# Patient Record
Sex: Male | Born: 1995
Health system: Southern US, Community
[De-identification: ages and names within clinical notes are randomized; demographics above are authoritative.]

## PROBLEM LIST (undated history)

## (undated) DIAGNOSIS — E639 Nutritional deficiency, unspecified: Secondary | ICD-10-CM

## (undated) DIAGNOSIS — Z720 Tobacco use: Secondary | ICD-10-CM

## (undated) DIAGNOSIS — E669 Obesity, unspecified: Principal | ICD-10-CM

## (undated) HISTORY — PX: TONSILLECTOMY: SUR1361

## (undated) HISTORY — DX: Tobacco use: Z72.0

## (undated) HISTORY — PX: EAR TUBE REMOVAL: SHX1486

## (undated) HISTORY — DX: Nutritional deficiency, unspecified: E63.9

## (undated) HISTORY — DX: Obesity, unspecified: E66.9

---
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
## 2016-06-28 ENCOUNTER — Encounter: Payer: Self-pay | Admitting: Family Medicine

## 2016-06-28 ENCOUNTER — Ambulatory Visit (INDEPENDENT_AMBULATORY_CARE_PROVIDER_SITE_OTHER): Payer: PRIVATE HEALTH INSURANCE | Admitting: Family Medicine

## 2016-06-28 VITALS — BP 110/69 | HR 55 | Ht 70.0 in | Wt 242.1 lb

## 2016-06-28 DIAGNOSIS — Z720 Tobacco use: Secondary | ICD-10-CM | POA: Diagnosis not present

## 2016-06-28 DIAGNOSIS — E669 Obesity, unspecified: Secondary | ICD-10-CM | POA: Insufficient documentation

## 2016-06-28 DIAGNOSIS — E639 Nutritional deficiency, unspecified: Secondary | ICD-10-CM | POA: Diagnosis not present

## 2016-06-28 HISTORY — DX: Obesity, unspecified: E66.9

## 2016-06-28 HISTORY — DX: Tobacco use: Z72.0

## 2016-06-28 HISTORY — DX: Nutritional deficiency, unspecified: E63.9

## 2016-06-28 NOTE — Assessment & Plan Note (Signed)
Patient admittedly does not eat the best diet and we will discuss this further in the future. I recommend he looked up online about Mediterranean diets.

## 2016-06-28 NOTE — Patient Instructions (Addendum)
Discussed with patient weight loss and how in the future if he wants to we can discuss pharmacological treatment of that.  Discussed with patient needs yearly screening for STDs. Even though he is very low risk.  Normal blood pressure 119/79 or less.  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  120-150oz/water per day.            Why follow it? Research shows. . Those who follow the Mediterranean diet have a reduced risk of heart disease  . The diet is associated with a reduced incidence of Parkinson's and Alzheimer's diseases . People following the diet may have longer life expectancies and lower rates of chronic diseases  . The Dietary Guidelines for Americans recommends the Mediterranean diet as an eating plan to promote health and prevent disease  What Is the Mediterranean Diet?  . Healthy eating plan based on typical foods and recipes of Mediterranean-style cooking . The diet is primarily a plant based diet; these foods should make up a majority of meals   Starches - Plant based foods should make up a majority of meals - They are an important sources of vitamins, minerals, energy, antioxidants, and fiber - Choose whole grains, foods high in fiber and minimally processed items  - Typical grain sources include wheat, oats, barley, corn, brown rice, bulgar, farro, millet, polenta, couscous  - Various types of beans include chickpeas, lentils, fava beans, black beans, white beans   Fruits  Veggies - Large quantities of antioxidant rich fruits & veggies; 6 or more servings  - Vegetables can be eaten raw or lightly drizzled with oil and cooked  - Vegetables common to the traditional Mediterranean Diet include: artichokes, arugula, beets, broccoli, brussel sprouts, cabbage, carrots, celery, collard greens, cucumbers, eggplant, kale, leeks, lemons, lettuce, mushrooms, okra, onions, peas, peppers, potatoes, pumpkin, radishes,  rutabaga, shallots, spinach, sweet potatoes, turnips, zucchini - Fruits common to the Mediterranean Diet include: apples, apricots, avocados, cherries, clementines, dates, figs, grapefruits, grapes, melons, nectarines, oranges, peaches, pears, pomegranates, strawberries, tangerines  Fats - Replace butter and margarine with healthy oils, such as olive oil, canola oil, and tahini  - Limit nuts to no more than a handful a day  - Nuts include walnuts, almonds, pecans, pistachios, pine nuts  - Limit or avoid candied, honey roasted or heavily salted nuts - Olives are central to the [employer] - can be eaten whole or used in a variety of dishes   Meats Protein - Limiting red meat: no more than a few times a month - When eating red meat: choose lean cuts and keep the portion to the size of deck of cards - Eggs: approx. 0 to 4 times a week  - Fish and lean poultry: at least 2 a week  - Healthy protein sources include, chicken, [place], lean beef, lamb - Increase intake of seafood such as tuna, salmon, trout, mackerel, shrimp, scallops - Avoid or limit high fat processed meats such as sausage and bacon  Dairy - Include moderate amounts of low fat dairy products  - Focus on healthy dairy such as fat free yogurt, skim milk, low or reduced fat cheese - Limit dairy products higher in fat such as whole or 2% milk, cheese, ice cream  Alcohol - Moderate amounts of red wine is ok  - No more than 5 oz daily for women (all ages) and men older than age 87  -  No more than 10 oz of wine daily for men younger than 68  Other - Limit sweets and other desserts  - Use herbs and spices instead of salt to flavor foods  - Herbs and spices common to the traditional Mediterranean Diet include: basil, bay leaves, chives, cloves, cumin, fennel, garlic, lavender, marjoram, mint, oregano, parsley, pepper, rosemary, sage, savory, sumac, tarragon, thyme   It's not just a diet, it's a lifestyle:  . The Mediterranean diet  includes lifestyle factors typical of those in the region  . Foods, drinks and meals are best eaten with others and savored . Daily physical activity is important for overall good health . This could be strenuous exercise like running and aerobics . This could also be more leisurely activities such as walking, housework, yard-work, or taking the stairs . Moderation is the key; a balanced and healthy diet accommodates most foods and drinks . Consider portion sizes and frequency of consumption of certain foods   Meal Ideas & Options:  . Breakfast:  o Whole wheat toast or whole wheat English muffins with peanut butter & hard boiled egg o Steel cut oats topped with apples & cinnamon and skim milk  o Fresh fruit: banana, strawberries, melon, berries, peaches  o Smoothies: strawberries, bananas, greek yogurt, peanut butter o Low fat greek yogurt with blueberries and granola  o Egg white omelet with spinach and mushrooms o Breakfast couscous: whole wheat couscous, apricots, skim milk, cranberries  . Sandwiches:  o Hummus and grilled vegetables (peppers, zucchini, squash) on whole wheat bread   o Grilled chicken on whole wheat pita with lettuce, tomatoes, cucumbers or tzatziki  o Tuna salad on whole wheat bread: tuna salad made with greek yogurt, olives, red peppers, capers, green onions o Garlic rosemary lamb pita: lamb sauted with garlic, rosemary, salt & pepper; add lettuce, cucumber, greek yogurt to pita - flavor with lemon juice and black pepper  . Seafood:  o Mediterranean grilled salmon, seasoned with garlic, basil, parsley, lemon juice and black pepper o Shrimp, lemon, and spinach whole-grain pasta salad made with low fat greek yogurt  o Seared scallops with lemon orzo  o Seared tuna steaks seasoned salt, pepper, coriander topped with tomato mixture of olives, tomatoes, olive oil, minced garlic, parsley, green onions and cappers  . Meats:  o Herbed greek chicken salad with kalamata  olives, cucumber, feta  o Red bell peppers stuffed with spinach, bulgur, lean ground beef (or lentils) & topped with feta   o Kebabs: skewers of chicken, tomatoes, onions, zucchini, squash  o [place] burgers: made with red onions, mint, dill, lemon juice, feta cheese topped with roasted red peppers . Vegetarian o Cucumber salad: cucumbers, artichoke hearts, celery, red onion, feta cheese, tossed in olive oil & lemon juice  o Hummus and whole grain pita points with a greek salad (lettuce, tomato, feta, olives, cucumbers, red onion) o Lentil soup with celery, carrots made with vegetable broth, garlic, salt and pepper  o Tabouli salad: parsley, bulgur, mint, scallions, cucumbers, tomato, radishes, lemon juice, olive oil, salt and pepper.        Exercising to Lose Weight Exercising can help you to lose weight. In order to lose weight through exercise, you need to do vigorous-intensity exercise. You can tell that you are exercising with vigorous intensity if you are breathing very hard and fast and cannot hold a conversation while exercising. Moderate-intensity exercise helps to maintain your current weight. You can tell that you are exercising at a  moderate level if you have a higher heart rate and faster breathing, but you are still able to hold a conversation. HOW OFTEN SHOULD I EXERCISE? Choose an activity that you enjoy and set realistic goals. Your health care provider can help you to make an activity plan that works for you. Exercise regularly as directed by your health care provider. This may include:  Doing resistance training twice each week, such as:  Push-ups.  Sit-ups.  Lifting weights.  Using resistance bands.  Doing a given intensity of exercise for a given amount of time. Choose from these options:  150 minutes of moderate-intensity exercise every week.  75 minutes of vigorous-intensity exercise every week.  A mix of moderate-intensity and vigorous-intensity exercise every  week. Children, pregnant women, people who are out of shape, people who are overweight, and older adults may need to consult a health care provider for individual recommendations. If you have any sort of medical condition, be sure to consult your health care provider before starting a new exercise program. WHAT ARE SOME ACTIVITIES THAT CAN HELP ME TO LOSE WEIGHT?   Walking at a rate of at least 4.5 miles an hour.  Jogging or running at a rate of 5 miles per hour.  Biking at a rate of at least 10 miles per hour.  Lap swimming.  Roller-skating or in-line skating.  Cross-country skiing.  Vigorous competitive sports, such as football, basketball, and soccer.  Jumping rope.  Aerobic dancing. HOW CAN I BE MORE ACTIVE IN MY DAY-TO-DAY ACTIVITIES?  Use the stairs instead of the elevator.  Take a walk during your lunch break.  If you drive, park your car farther away from work or school.  If you take public transportation, get off one stop early and walk the rest of the way.  Make all of your phone calls while standing up and walking around.  Get up, stretch, and walk around every 30 minutes throughout the day. WHAT GUIDELINES SHOULD I FOLLOW WHILE EXERCISING?  Do not exercise so much that you hurt yourself, feel dizzy, or get very short of breath.  Consult your health care provider prior to starting a new exercise program.  Wear comfortable clothes and shoes with good support.  Drink plenty of water while you exercise to prevent dehydration or heat stroke. Body water is lost during exercise and must be replaced.  Work out until you breathe faster and your heart beats faster.   This information is not intended to replace advice given to you by your health care provider. Make sure you discuss any questions you have with your health care provider.   Document Released: 01/13/2011 Document Revised: 01/01/2015 Document Reviewed: 05/14/2014 Elsevier Interactive Patient Education  [employer].

## 2016-06-28 NOTE — Assessment & Plan Note (Signed)
Recommended patient go see a dentist at least twice yearly. Recommend abstinence. Patient will consider it.

## 2016-06-28 NOTE — Progress Notes (Signed)
Marjory Sneddon, D.O. Primary care at [place]:    Chief Complaint  Patient presents with  . Establish Care   New pt, here to establish care.   HPI: Richard Petersen is a pleasant 20 y.o. male who presents to [facility] today To become established. Patient does not recall the last time he was at a primary care physician's office.  No complaints today.  Here because his mother told him he should come.  No formal exercise. 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  Maybe drinks "a couple of beers a couple days of the week."  Is going to the dentist at least yearly.     Past Medical History  Diagnosis Date  . Obese 06/28/2016  . Chews tobacco 06/28/2016  . Poor diet 06/28/2016      Past Surgical History  Procedure Laterality Date  . Tonsillectomy    . Ear tube removal        Family History  Problem Relation Age of Onset  . Hypertension Mother   . Hypertension Father       History  Drug Use No  ,    History  Alcohol Use No  ,    History  Smoking status  . Never Smoker   Smokeless tobacco  . Current User  . Types: Chew  ,     History  Sexual Activity  . Sexual Activity: Not Currently      Patient's Medications   No medications on file     Amoxicillin No outpatient encounter prescriptions on file as of 06/28/2016.   No facility-administered encounter medications on file as of 06/28/2016.      There are no preventive care reminders to display for this patient.    There is no immunization history on file for this patient.   <no information>   Fall Risk  06/28/2016  Falls in the past year? No     Depression screen PHQ 2/9 06/28/2016  Decreased Interest 0  Down, Depressed, Hopeless 0  PHQ - 2 Score 0      Review of Systems:   ( Completed via Adult Medical History Intake form  today ) General:   Denies fever, chills, appetite changes, unexplained weight loss.  Optho/Auditory:   Denies visual changes, blurred vision/LOV, ringing in ears/ diff hearing Respiratory:   Denies SOB, DOE, cough, wheezing.  Cardiovascular:   Denies chest pain, palpitations, new onset peripheral edema  Gastrointestinal:   Denies nausea, vomiting, diarrhea.  Genitourinary:    Denies dysuria, increased frequency, flank pain.  Endocrine:     Denies hot or cold intolerance, polyuria, polydipsia. Musculoskeletal:  Denies unexplained myalgias, joint swelling, arthralgias, gait problems.  Skin:  Denies rash, suspicious lesions or new/ changes in moles Neurological:    Denies dizziness, syncope, unexplained weakness, lightheadedness, numbness  Psychiatric/Behavioral:   Denies mood changes, suicidal or homicidal ideations, hallucinations    Objective:   Blood pressure 110/69, pulse 55, height 5\' 10"  (1.778 m), weight 242 lb 1.6 oz (109.816 kg). Body mass index is 34.74 kg/(m^2).  General: Well Developed, well nourished, and in no acute distress.  Neuro: Alert and oriented x3, extra-ocular muscles intact, sensation grossly intact.  HEENT: Normocephalic, atraumatic, pupils equal round reactive to light, neck supple Skin: no gross suspicious lesions or rashes  Cardiac: Regular rate and rhythm, no murmurs rubs or gallops.  Respiratory: Essentially clear to auscultation bilaterally. Not using accessory muscles, speaking in full sentences.  Abdominal: Soft, not grossly distended Musculoskeletal: Ambulates w/o diff, FROM * 4 ext.  Vasc: less 2 sec cap RF, warm and pink  Psych:  No HI/SI, judgement and insight good.    Impression and Recommendations:    The patient was counselled, risk factors were discussed, anticipatory guidance given.  Chews tobacco Recommended patient go see a dentist at least twice yearly. Recommend abstinence. Patient will consider it.  Obese Counseling done regarding  the connection between being overweight and various disease states of hyperlipidemia, Hypertension, prediabetes, arthritis etc.  Recommend the patient consider losing weight and I recommend he make a follow-up office visit with me in the future to discuss various treatment options and make a plan together.  Patient is fasting today so we will obtain blood work today.  Poor diet Patient admittedly does not eat the best diet and we will discuss this further in the future. I recommend he looked up online about Mediterranean diets.   Please see AVS handed out to patient at the end of our visit for further patient instructions/ counseling done pertaining to today's office visit.  Gross side effects, risk and benefits, and alternatives of medications discussed with patient.  Patient is aware that all medications have potential side effects and we are unable to predict every sideeffect or drug-drug interaction that may occur.  Expresses verbal understanding and consents to current therapy plan and treatment regiment.  Note: This document was prepared using Dragon voice recognition software and may include unintentional dictation errors.

## 2016-06-28 NOTE — Assessment & Plan Note (Signed)
Counseling done regarding the connection between being overweight and various disease states of hyperlipidemia, Hypertension, prediabetes, arthritis etc.  Recommend the patient consider losing weight and I recommend he make a follow-up office visit with me in the future to discuss various treatment options and make a plan together.  Patient is fasting today so we will obtain blood work today.

## 2016-06-29 LAB — CBC
HCT: 46 % (ref 38.5–50.0)
Hemoglobin: 15.5 g/dL (ref 13.2–17.1)
MCH: 29.5 pg (ref 27.0–33.0)
MCHC: 33.7 g/dL (ref 32.0–36.0)
MCV: 87.5 fL (ref 80.0–100.0)
MPV: 9.3 fL (ref 7.5–12.5)
PLATELETS: 223 10*3/uL (ref 140–400)
RBC: 5.26 MIL/uL (ref 4.20–5.80)
RDW: 13.9 % (ref 11.0–15.0)
WBC: 5.3 10*3/uL (ref 3.8–10.8)

## 2016-06-29 LAB — COMPREHENSIVE METABOLIC PANEL
ALT: 21 U/L (ref 9–46)
AST: 15 U/L (ref 10–40)
Albumin: 5 g/dL (ref 3.6–5.1)
Alkaline Phosphatase: 71 U/L (ref 40–115)
BUN: 15 mg/dL (ref 7–25)
CALCIUM: 9.6 mg/dL (ref 8.6–10.3)
CO2: 26 mmol/L (ref 20–31)
Chloride: 101 mmol/L (ref 98–110)
Creat: 0.87 mg/dL (ref 0.60–1.35)
GLUCOSE: 85 mg/dL (ref 65–99)
POTASSIUM: 4.2 mmol/L (ref 3.5–5.3)
SODIUM: 140 mmol/L (ref 135–146)
Total Bilirubin: 0.9 mg/dL (ref 0.2–1.2)
Total Protein: 7.2 g/dL (ref 6.1–8.1)

## 2016-06-29 LAB — LIPID PANEL
CHOL/HDL RATIO: 2.3 ratio (ref <=5.0)
CHOLESTEROL: 117 mg/dL — AB (ref 125–170)
HDL: 51 mg/dL (ref >=40)
LDL CALC: 57 mg/dL (ref <110)
TRIGLYCERIDES: 46 mg/dL (ref <150)
VLDL: 9 mg/dL (ref <30)

## 2016-06-29 LAB — HEMOGLOBIN A1C
Hgb A1c MFr Bld: 4.7 % (ref <5.7)
Mean Plasma Glucose: 88 mg/dL

## 2016-06-29 LAB — TSH: TSH: 1.74 mIU/L (ref 0.40–4.50)

## 2016-09-08 ENCOUNTER — Ambulatory Visit: Payer: PRIVATE HEALTH INSURANCE

## 2017-03-14 ENCOUNTER — Encounter: Payer: Self-pay | Admitting: Adult Health

## 2017-03-14 ENCOUNTER — Ambulatory Visit (INDEPENDENT_AMBULATORY_CARE_PROVIDER_SITE_OTHER): Payer: PRIVATE HEALTH INSURANCE | Admitting: Adult Health

## 2017-03-14 VITALS — BP 130/67 | HR 68 | Ht 70.25 in | Wt 246.0 lb

## 2017-03-14 DIAGNOSIS — K921 Melena: Secondary | ICD-10-CM

## 2017-03-14 NOTE — Patient Instructions (Signed)
Lower Gastrointestinal Bleeding °Lower gastrointestinal (GI) bleeding is the result of bleeding from the colon, rectum, or anal area. The colon is the last part of the digestive tract, where stool, also called feces, is formed. If you have lower GI bleeding, you may see blood in or on your stool. It may be bright red. °Lower GI bleeding often stops without treatment. Continued or heavy bleeding needs emergency treatment at the hospital. °What are the causes? °Lower GI bleeding may be caused by: °· A condition that causes pouches to form in the colon over time (diverticulosis). °· Swelling and irritation (inflammation) in areas with diverticulosis (diverticulitis). °· Inflammation of the colon (inflammatory bowel disease). °· Swollen veins in the rectum (hemorrhoids). °· Painful tears in the anus (anal fissures), often caused by passing hard stools. °· Cancer of the colon or rectum. °· Noncancerous growths (polyps) of the colon or rectum. °· A bleeding disorder that impairs the formation of blood clots and causes easy bleeding (coagulopathy). °· An abnormal weakening of a blood vessel where an artery and a vein come together (arteriovenous malformation). °What increases the risk? °You are more likely to develop this condition if: °· You are older than 21 years of age. °· You take aspirin or NSAIDs on a regular basis. °· You take anticoagulant or antiplatelet drugs. °· You have a history of high-dose X-ray treatment (radiation therapy) of the colon. °· You recently had a colon polyp removed. °What are the signs or symptoms? °Symptoms of this condition include: °· Bright red blood or blood clots coming from your rectum. °· Bloody stools. °· Black or maroon-colored stools. °· Pain or cramping in the abdomen. °· Weakness or dizziness. °· Racing heartbeat. °How is this diagnosed? °This condition may be diagnosed based on: °· Your symptoms and medical history. °· A physical exam. During the exam, your health care provider  will check for signs of blood loss, such as low blood pressure and a rapid pulse. °· Tests, such as: °¨ Flexible sigmoidoscopy. In this procedure, a flexible tube with a camera on the end is used to examine your anus and the first part of your colon to look for the source of bleeding. °¨ Colonoscopy. This is similar to a flexible sigmoidoscopy, but the camera can extend all the way to the uppermost part of your colon. °¨ Blood tests to measure your red blood cell count and to check for coagulopathy. °¨ An imaging study of your colon to look for a bleeding site. In some cases, you may have X-rays taken after a dye or radioactive substance is injected into your bloodstream (angiogram). °How is this treated? °Treatment for this condition depends on the cause of the bleeding. Heavy or persistent bleeding is treated at the hospital. Treatment may include: °· Getting fluids through an IV tube inserted into one of your veins. °· Getting blood through an IV tube (blood transfusion). °· Stopping bleeding through high-heat coagulation, injections of certain medicines, or applying surgical clips. This can all be done during a colonoscopy. °· Having a procedure that involves first doing an angiogram and then blocking blood flow to the bleeding site (embolization). °· Stopping some of your regular medicines for a certain amount of time. °· Having surgery to remove part of the colon. This may be needed if bleeding is severe and does not respond to other treatment. °Follow these instructions at home: °· Take over-the-counter and prescription medicines only as told by your health care provider. You may need to avoid   aspirin, NSAIDs, or other medicines that increase bleeding.  Eat foods that are high in fiber. This will help keep your stools soft. These foods include whole grains, legumes, fruits, and vegetables. Eating 1-3 prunes each day works well for many people.  Drink enough fluid to keep your urine clear or pale  yellow.  Keep all follow-up visits as told by your health care provider. This is important. Contact a health care provider if:  Your symptoms do not improve. Get help right away if:  Your bleeding increases.  You feel light-headed or you faint.  You feel weak.  You have severe cramps in your back or abdomen.  You pass large blood clots in your stool.  Your symptoms get worse. This information is not intended to replace advice given to you by your health care provider. Make sure you discuss any questions you have with your health care provider. Document Released: 04/27/2016 Document Revised: 05/18/2016 Document Reviewed: 04/27/2016 Elsevier Interactive Patient Education  2017 [employer].  Will call when lab results are available. Continue healthy eating and regular exercise. Avoid alcohol and tobacco. Please call clinic with any questions/concerns.

## 2017-03-14 NOTE — Progress Notes (Signed)
Subjective:    Patient ID: Richard Petersen, male    DOB: Apr 24, 1996, 21 y.o.   MRN: [id number]  HPI:  Mr. Dehart presents for blood in stool that began approx. 2 months ago. Reports blood is bright red and he notices both on toilet paper and mixed in with stool.  He reports when visiting the beach several weeks ago "the toilet bowl was filled with red blood", this only occurred once.  He denies N/V or blood in urine.  He estimates blood in stool 1-2 times a week and he reports mild/gnawing LLQ pain prior to BM with blood that will resolve after BM.  He denies this ever occurring before. He denies recent travel outside the [place] or change in eating habits prior to onset of sx's.  He denies first degree family with colon ca, however father and sister both have hemorrhoids.  He denies fever/night sweats/malaise/change in appetite/unexplained weight loss. His normal evacuation patten-BM every 2 days, denies straining or diarrhea. About a week ago he started a low CHO diet and increased regular exercise. He denies smoking or excessive ETOH use, however does use chewing tobacco. Mother at [facility] during encounter. Of note:  It was very difficult to obtain detailed history, speint > 30 mins during H&P information session.  Patient Care Team    Relationship Specialty Notifications Start End  Mellody Dance, DO PCP - General Family Medicine  06/28/16     Patient Active Problem List   Diagnosis Date Noted  . Blood in stool 03/14/2017  . Obese 06/28/2016  . Chews tobacco 06/28/2016  . Poor diet 06/28/2016     Past Medical History:  Diagnosis Date  . Chews tobacco 06/28/2016  . Obese 06/28/2016  . Poor diet 06/28/2016     Past Surgical History:  Procedure Laterality Date  . EAR TUBE REMOVAL    . TONSILLECTOMY       Family History  Problem Relation Age of Onset  . Hypertension Mother   . Hypertension Father      History  Drug Use No     History  Alcohol Use No     History  Smoking  Status  . Never Smoker  Smokeless Tobacco  . Current User  . Types: Chew     No outpatient encounter prescriptions on file as of 03/14/2017.   No facility-administered encounter medications on file as of 03/14/2017.     Allergies: Amoxicillin  There is no height or weight on file to calculate BMI.  There were no vitals taken for this visit.   Review of Systems  Constitutional: Negative for activity change, appetite change, chills, diaphoresis, fatigue, fever and unexpected weight change.  Eyes: Negative for visual disturbance.  Respiratory: Negative for cough, chest tightness, shortness of breath, wheezing and stridor.   Cardiovascular: Negative for chest pain and leg swelling.  Gastrointestinal: Positive for abdominal pain and blood in stool. Negative for abdominal distention, anal bleeding, constipation, diarrhea, nausea, rectal pain and vomiting.  Endocrine: Negative for cold intolerance, heat intolerance, polydipsia, polyphagia and polyuria.  Genitourinary: Negative for difficulty urinating and hematuria.  Skin: Negative for color change, pallor, rash and wound.  Allergic/Immunologic: Negative for immunocompromised state.  Neurological: Negative for dizziness and headaches.  Hematological: Does not bruise/bleed easily.       Objective:   Physical Exam  Constitutional: He is oriented to person, place, and time. He appears well-developed and well-nourished. No distress.  HENT:  Head: Normocephalic and atraumatic.  Cardiovascular: Normal rate,  regular rhythm, normal heart sounds and intact distal pulses.   No murmur heard. Pulmonary/Chest: Effort normal and breath sounds normal. No respiratory distress. He has no wheezes. He has no rales. He exhibits no tenderness.  Abdominal: Soft. Bowel sounds are normal. He exhibits no distension and no mass. There is no hepatosplenomegaly. There is no tenderness. There is no rigidity, no rebound, no guarding, no CVA tenderness, no  tenderness at McBurney's point and negative Murphy's sign.  Refused Guaiac testing twice. Did consent to lab draw for CBC.  Neurological: He is alert and oriented to person, place, and time.  Skin: Skin is warm and dry. No rash noted. He is not diaphoretic. No erythema. No pallor.  Psychiatric: He has a normal mood and affect. His behavior is normal. Judgment and thought content normal.  Nursing note and vitals reviewed.         Assessment & Plan:   1. Blood in stool     Blood in stool Refused Guaiac testing twice during encounter. Consented to CBC lab draw-will call when results are available. GI referral placed. Discussed at length the IMPORTANCE of going to GI. He communicated understanding/agreement. Mother at [facility].    FOLLOW-UP:  Return if symptoms worsen or fail to improve.

## 2017-03-14 NOTE — Assessment & Plan Note (Signed)
Refused Guaiac testing twice during encounter. Consented to CBC lab draw-will call when results are available. GI referral placed. Discussed at length the IMPORTANCE of going to GI. He communicated understanding/agreement. Mother at [facility].

## 2017-03-15 LAB — CBC WITH DIFFERENTIAL/PLATELET
BASOS: 0 %
Basophils Absolute: 0 10*3/uL (ref 0.0–0.2)
EOS (ABSOLUTE): 0.2 10*3/uL (ref 0.0–0.4)
Eos: 3 %
HEMATOCRIT: 42.7 % (ref 37.5–51.0)
HEMOGLOBIN: 15.2 g/dL (ref 13.0–17.7)
IMMATURE GRANS (ABS): 0 10*3/uL (ref 0.0–0.1)
Immature Granulocytes: 0 %
LYMPHS ABS: 2.2 10*3/uL (ref 0.7–3.1)
LYMPHS: 27 %
MCH: 29.6 pg (ref 26.6–33.0)
MCHC: 35.6 g/dL (ref 31.5–35.7)
MCV: 83 fL (ref 79–97)
MONOCYTES: 14 %
Monocytes Absolute: 1.1 10*3/uL — ABNORMAL HIGH (ref 0.1–0.9)
NEUTROS ABS: 4.4 10*3/uL (ref 1.4–7.0)
Neutrophils: 56 %
Platelets: 240 10*3/uL (ref 150–379)
RBC: 5.13 x10E6/uL (ref 4.14–5.80)
RDW: 13 % (ref 12.3–15.4)
WBC: 7.9 10*3/uL (ref 3.4–10.8)

## 2017-03-16 ENCOUNTER — Encounter: Payer: Self-pay | Admitting: Gastroenterology

## 2017-04-23 ENCOUNTER — Telehealth: Payer: Self-pay | Admitting: Gastroenterology

## 2017-04-23 ENCOUNTER — Ambulatory Visit: Payer: PRIVATE HEALTH INSURANCE | Admitting: Gastroenterology

## 2018-08-22 ENCOUNTER — Encounter: Payer: Self-pay | Admitting: Internal Medicine

## 2018-10-23 ENCOUNTER — Encounter: Payer: Self-pay | Admitting: *Deleted

## 2018-10-23 ENCOUNTER — Ambulatory Visit (INDEPENDENT_AMBULATORY_CARE_PROVIDER_SITE_OTHER): Payer: 59 | Admitting: Internal Medicine

## 2018-10-23 VITALS — BP 112/78 | HR 66 | Ht 72.0 in | Wt 227.0 lb

## 2018-10-23 DIAGNOSIS — K625 Hemorrhage of anus and rectum: Secondary | ICD-10-CM

## 2018-10-23 MED ORDER — SUPREP BOWEL PREP KIT 17.5-3.13-1.6 GM/177ML PO SOLN: 1.0000 | ORAL | 0 refills | Status: DC

## 2018-10-23 NOTE — Patient Instructions (Addendum)
You have been scheduled for a colonoscopy. Please follow written instructions given to you at your visit today.  Please pick up your prep supplies at the pharmacy within the next 1-3 days. If you use inhalers (even only as needed), please bring them with you on the day of your procedure. Your physician has requested that you go to www.startemmi.com and enter the access code given to you at your visit today. This web site gives a general overview about your procedure. However, you should still follow specific instructions given to you by our office regarding your preparation for the procedure.  If you are age 4 or older, your body mass index should be between 23-30. Your Body mass index is 30.79 kg/m. If this is out of the aforementioned range listed, please consider follow up with your Primary Care Provider.  If you are age 22 or younger, your body mass index should be between 19-25. Your Body mass index is 30.79 kg/m. If this is out of the aformentioned range listed, please consider follow up with your Primary Care Provider.

## 2018-10-23 NOTE — Progress Notes (Signed)
Patient ID: Richard Petersen, male   DOB: 1996/12/20, 22 y.o.   MRN: [id number] HPI: Richard Petersen is a 22 year old male with little past medical history who is seen in consultation at the request of Dr. Raliegh Scarlet to evaluate rectal bleeding.  He is here today with his mother.  He reports that he has had intermittent rectal bleeding over the last 18 months but perhaps as far back as 3 years.  He reports this is infrequent and painless.  He notices it when he looks in the toilet or with wiping.  He reports at times the blood has been rather high volume and filled the toilet with red blood.  He reports this can happen on one isolated day and then not again or it can happen 2 to 3 days in a row.  He denies a change in bowel habit and reports his stools vary from loose to normal to hard at times.  He will have a bowel movement every day to every 2 to 3 days.  He rarely goes more than once per day.  He is not having abdominal pain, rectal pain, tenesmus.  For him he feels that his bowel habits have not changed.  He does not see narrow or thin stool.  He denies melena.  No upper GI complaints such as heartburn, dysphagia or odynophagia.  No hepatobiliary complaint.  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  Only prior surgery was tonsillectomy   Past Medical History:  Diagnosis Date  . Chews tobacco 06/28/2016  . Obese 06/28/2016  . Poor diet 06/28/2016    Past Surgical History:  Procedure Laterality Date  . EAR TUBE REMOVAL    . TONSILLECTOMY      No outpatient medications prior to visit.   No facility-administered medications prior to visit.     Allergies  Allergen Reactions  . Amoxicillin Other (See Comments)    agitation    Family History  Problem Relation Age of Onset  . Hypertension Mother   . Hypertension Father     Social History   Tobacco Use  . Smoking status: Never Smoker  . Smokeless tobacco: Current User    Types: Chew  Substance Use Topics  .  Alcohol use: No  . Drug use: No    ROS: As per history of present illness, otherwise negative  There were no vitals taken for this visit. Constitutional: Well-developed and well-nourished. No distress. HEENT: Normocephalic and atraumatic.  Conjunctivae are normal.  No scleral icterus. Neck: Neck supple. Trachea midline. Cardiovascular: Normal rate, regular rhythm and intact distal pulses. No M/R/G Pulmonary/chest: Effort normal and breath sounds normal. No wheezing, rales or rhonchi. Abdominal: Soft, nontender, nondistended. Bowel sounds active throughout. There are no masses palpable. No hepatosplenomegaly. Extremities: no clubbing, cyanosis, or edema Neurological: Alert and oriented to person place and time. Skin: Skin is warm and dry.  Psychiatric: Normal mood and affect. Behavior is normal.  RELEVANT LABS AND IMAGING: CBC    Component Value Date/Time   WBC 7.9 03/14/2017 1606   WBC 5.3 06/28/2016 0933   RBC 5.13 03/14/2017 1606   RBC 5.26 06/28/2016 0933   HGB 15.2 03/14/2017 1606   HCT 42.7 03/14/2017 1606   PLT 240 03/14/2017 1606   MCV 83 03/14/2017 1606   MCH 29.6 03/14/2017 1606   MCH 29.5 06/28/2016 0933   MCHC 35.6 03/14/2017 1606   MCHC 33.7 06/28/2016 0933   RDW 13.0 03/14/2017 1606   LYMPHSABS  2.2 03/14/2017 1606   MONOABS 0.9 02/23/2009 1200   EOSABS 0.2 03/14/2017 1606   BASOSABS 0.0 03/14/2017 1606    CMP     Component Value Date/Time   NA 140 06/28/2016 0933   K 4.2 06/28/2016 0933   CL 101 06/28/2016 0933   CO2 26 06/28/2016 0933   GLUCOSE 85 06/28/2016 0933   BUN 15 06/28/2016 0933   CREATININE 0.87 06/28/2016 0933   CALCIUM 9.6 06/28/2016 0933   PROT 7.2 06/28/2016 0933   ALBUMIN 5.0 06/28/2016 0933   AST 15 06/28/2016 0933   ALT 21 06/28/2016 0933   ALKPHOS 71 06/28/2016 0933   BILITOT 0.9 06/28/2016 0933    ASSESSMENT/PLAN: 22 year old male with little past medical history who presents to evaluate rectal bleeding.   1.  Rectal  bleeding --22 year old with painless rectal bleeding.  Symptoms are not that for fissure.  Most likely internal hemorrhoid but given intermittent and ongoing nature over time I recommend that we pursue colonoscopy for definitive diagnosis and to exclude other pathology such as proctitis, or much less likely distal colonic polyp.  We discussed the nature of the procedure including the risk, benefits and alternatives and he is agreeable and wishes to proceed.   OV:[id number], Neoma Laming, Do [address] [place], [place] [address]

## 2018-11-01 ENCOUNTER — Encounter: Payer: Self-pay | Admitting: Internal Medicine

## 2018-11-01 ENCOUNTER — Ambulatory Visit (AMBULATORY_SURGERY_CENTER): Payer: 59 | Admitting: Internal Medicine

## 2018-11-01 VITALS — BP 101/45 | HR 68 | Temp 99.1°F | Resp 13 | Ht 72.0 in | Wt 227.0 lb

## 2018-11-01 DIAGNOSIS — K621 Rectal polyp: Secondary | ICD-10-CM | POA: Diagnosis not present

## 2018-11-01 DIAGNOSIS — K625 Hemorrhage of anus and rectum: Secondary | ICD-10-CM | POA: Diagnosis not present

## 2018-11-01 DIAGNOSIS — K6289 Other specified diseases of anus and rectum: Secondary | ICD-10-CM

## 2018-11-01 DIAGNOSIS — D123 Benign neoplasm of transverse colon: Secondary | ICD-10-CM

## 2018-11-01 DIAGNOSIS — K635 Polyp of colon: Secondary | ICD-10-CM | POA: Diagnosis not present

## 2018-11-01 MED ORDER — SODIUM CHLORIDE 0.9 % IV SOLN: 500.0000 mL | Freq: Once | INTRAVENOUS | Status: DC

## 2018-11-01 NOTE — Progress Notes (Signed)
Pt's states no medical or surgical changes since previsit or office visit. 

## 2018-11-01 NOTE — Progress Notes (Signed)
PT taken to PACU. Monitors in place. VSS. Report given to RN. 

## 2018-11-01 NOTE — Op Note (Signed)
[place] Patient Name: Richard Petersen Procedure Date: 11/01/2018 7:59 AM MRN: [id number] Endoscopist: Jerene Bears , MD Age: 22 Referring MD:  Date of Birth: 1996/10/01 Gender: Male Account #: [id number] Procedure:                Colonoscopy Indications:              Rectal bleeding Medicines:                Monitored Anesthesia Care Procedure:                Pre-Anesthesia Assessment:                           - Prior to the procedure, a History and Physical                            was performed, and patient medications and                            allergies were reviewed. The patient's tolerance of                            previous anesthesia was also reviewed. The risks                            and benefits of the procedure and the sedation                            options and risks were discussed with the patient.                            All questions were answered, and informed consent                            was obtained. Prior Anticoagulants: The patient has                            taken no previous anticoagulant or antiplatelet                            agents. ASA Grade Assessment: II - A patient with                            mild systemic disease. After reviewing the risks                            and benefits, the patient was deemed in                            satisfactory condition to undergo the procedure.                           After obtaining informed consent, the colonoscope  was passed under direct vision. Throughout the                            procedure, the patient's blood pressure, pulse, and                            oxygen saturations were monitored continuously. The                            Colonoscope was introduced through the anus and                            advanced to the terminal ileum. The colonoscopy was                            performed without difficulty. The patient  tolerated                            the procedure well. The quality of the bowel                            preparation was good. The terminal ileum, ileocecal                            valve, appendiceal orifice, and rectum were                            photographed. Scope In: 8:08:48 AM Scope Out: 8:26:44 AM Scope Withdrawal Time: 0 hours 14 minutes 43 seconds  Total Procedure Duration: 0 hours 17 minutes 56 seconds  Findings:                 The digital rectal exam was normal.                           The terminal ileum appeared normal.                           A 3 mm polyp was found in the distal transverse                            colon. The polyp was sessile. The polyp was removed                            with a cold snare. Resection and retrieval were                            complete.                           A patchy area of mildly erythematous mucosa was                            found in the distal rectum. This was biopsied with  a cold forceps for histology.                           Internal hemorrhoids were found during                            retroflexion. The hemorrhoids were small.                           The exam was otherwise without abnormality. Complications:            No immediate complications. Estimated Blood Loss:     Estimated blood loss was minimal. Impression:               - The examined portion of the ileum was normal.                           - One 3 mm polyp in the distal transverse colon,                            removed with a cold snare. Resected and retrieved.                           - Erythematous mucosa in the distal rectum.                            Biopsied.                           - Small internal hemorrhoids.                           - The examination was otherwise normal. Recommendation:           - Patient has a contact number available for                            emergencies. The  signs and symptoms of potential                            delayed complications were discussed with the                            patient. Return to normal activities tomorrow.                            Written discharge instructions were provided to the                            patient.                           - Resume previous diet.                           - Continue present medications.                           -  Await pathology results.                           - Repeat colonoscopy is recommended. The                            colonoscopy date will be determined after pathology                            results from today's exam become available for                            review. Jerene Bears, MD 11/01/2018 8:34:36 AM This report has been signed electronically.

## 2018-11-01 NOTE — Patient Instructions (Signed)
Handout given on polyps and hemorrhoids.  YOU HAD AN ENDOSCOPIC PROCEDURE TODAY AT [facility]:   Refer to the procedure report that was given to you for any specific questions about what was found during the examination.  If the procedure report does not answer your questions, please call your gastroenterologist to clarify.  If you requested that your care partner not be given the details of your procedure findings, then the procedure report has been included in a sealed envelope for you to review at your convenience later.  YOU SHOULD EXPECT: Some feelings of bloating in the abdomen. Passage of more gas than usual.  Walking can help get rid of the air that was put into your GI tract during the procedure and reduce the bloating. If you had a lower endoscopy (such as a colonoscopy or flexible sigmoidoscopy) you may notice spotting of blood in your stool or on the toilet paper. If you underwent a bowel prep for your procedure, you may not have a normal bowel movement for a few days.  Please Note:  You might notice some irritation and congestion in your nose or some drainage.  This is from the oxygen used during your procedure.  There is no need for concern and it should clear up in a day or so.  SYMPTOMS TO REPORT IMMEDIATELY:   Following lower endoscopy (colonoscopy or flexible sigmoidoscopy):  Excessive amounts of blood in the stool  Significant tenderness or worsening of abdominal pains  Swelling of the abdomen that is new, acute  Fever of 100F or higher   For urgent or emergent issues, a gastroenterologist can be reached at any hour by calling [phone].   DIET:  We do recommend a small meal at first, but then you may proceed to your regular diet.  Drink plenty of fluids but you should avoid alcoholic beverages for 24 hours.  ACTIVITY:  You should plan to take it easy for the rest of today and you should NOT DRIVE or use heavy machinery until tomorrow (because of the  sedation medicines used during the test).    FOLLOW UP: Our staff will call the number listed on your records the next business day following your procedure to check on you and address any questions or concerns that you may have regarding the information given to you following your procedure. If we do not reach you, we will leave a message.  However, if you are feeling well and you are not experiencing any problems, there is no need to return our call.  We will assume that you have returned to your regular daily activities without incident.  If any biopsies were taken you will be contacted by phone or by letter within the next 1-3 weeks.  Please call us at [phone] if you have not heard about the biopsies in 3 weeks.    SIGNATURES/CONFIDENTIALITY: You and/or your care partner have signed paperwork which will be entered into your electronic medical record.  These signatures attest to the fact that that the information above on your After Visit Summary has been reviewed and is understood.  Full responsibility of the confidentiality of this discharge information lies with you and/or your care-partner.

## 2018-11-01 NOTE — Progress Notes (Signed)
Called to room to assist during endoscopic procedure.  Patient ID and intended procedure confirmed with present staff. Received instructions for my participation in the procedure from the performing physician.  

## 2018-11-04 ENCOUNTER — Telehealth: Payer: Self-pay | Admitting: *Deleted

## 2018-11-04 ENCOUNTER — Telehealth: Payer: Self-pay

## 2018-11-04 NOTE — Telephone Encounter (Signed)
  Follow up Call-  Call back number 11/01/2018  Post procedure Call Back phone  # [phone]  Permission to leave phone message Yes  Some recent data might be hidden     Patient questions:  Do you have a fever, pain , or abdominal swelling? No. Pain Score  0 *  Have you tolerated food without any problems? Yes.    Have you been able to return to your normal activities? Yes.    Do you have any questions about your discharge instructions: Diet   No. Medications  No. Follow up visit  No.  Do you have questions or concerns about your Care? No.  Actions: * If pain score is 4 or above: No action needed, pain <4.

## 2018-11-04 NOTE — Telephone Encounter (Signed)
Follow up called attempted, no answer and VM is full.

## 2018-11-09 DIAGNOSIS — B084 Enteroviral vesicular stomatitis with exanthem: Secondary | ICD-10-CM | POA: Diagnosis not present

## 2018-11-11 ENCOUNTER — Encounter: Payer: Self-pay | Admitting: Internal Medicine

## 2018-11-12 ENCOUNTER — Telehealth: Payer: Self-pay | Admitting: Internal Medicine

## 2018-11-12 NOTE — Telephone Encounter (Signed)
Spoke with pts mother and let her know path letter has been mailed, reviewed letter with her.

## 2019-04-23 NOTE — Progress Notes (Signed)
Subjective:    Patient ID: Sarina Ser, male    DOB: 1996-11-09, 23 y.o.   MRN: [id number]  HPI:  Mr. Valinda Hoar presents for DOT physical He is 23 year old male PMH- He denies chronic medical conditions/daily medications He denies CP/dyspnea/dizziness/HA/palpitations He denies cough/fever/N/V/D  Of Note- He needs to follow up with his PCP for annual physical and fasting labs this summer Patient Care Team    Relationship Specialty Notifications Start End  Mellody Dance, DO PCP - General Family Medicine  06/28/16     Patient Active Problem List   Diagnosis Date Noted  . Encounter for examination required by Department of Transportation (DOT) 04/24/2019  . Obese 06/28/2016  . Chews tobacco 06/28/2016  . Poor diet 06/28/2016     Past Medical History:  Diagnosis Date  . Chews tobacco 06/28/2016  . Obese 06/28/2016  . Poor diet 06/28/2016     Past Surgical History:  Procedure Laterality Date  . EAR TUBE REMOVAL    . TONSILLECTOMY       Family History  Problem Relation Age of Onset  . Hypertension Mother   . Hypertension Father   . Diabetes Maternal Grandmother   . Prostate cancer Maternal Grandfather   . Heart disease Paternal Grandmother   . Heart disease Paternal Grandfather      Social History   Substance and Sexual Activity  Drug Use No     Social History   Substance and Sexual Activity  Alcohol Use No     Social History   Tobacco Use  Smoking Status Never Smoker  Smokeless Tobacco Current User  . Types: Chew     No outpatient encounter medications on file as of 04/24/2019.   No facility-administered encounter medications on file as of 04/24/2019.     Allergies: Amoxicillin  Body mass index is 31.57 kg/m.  Blood pressure 113/67, pulse (!) 52, height 5\' 10"  (1.778 m), weight 220 lb (99.8 kg).  Review of Systems  Constitutional: Negative for activity change, appetite change, chills, diaphoresis, fatigue, fever and unexpected weight  change.  HENT: Negative for congestion.   Eyes: Negative for visual disturbance.  Respiratory: Negative for cough, chest tightness, shortness of breath, wheezing and stridor.   Cardiovascular: Negative for chest pain and leg swelling.  Gastrointestinal: Negative for abdominal distention, abdominal pain, anal bleeding, blood in stool, constipation, diarrhea, nausea, rectal pain and vomiting.  Endocrine: Negative for cold intolerance, heat intolerance, polydipsia, polyphagia and polyuria.  Genitourinary: Negative for difficulty urinating, flank pain and frequency.  Musculoskeletal: Negative for arthralgias, back pain, gait problem, joint swelling, myalgias, neck pain and neck stiffness.  Skin: Negative for color change, pallor, rash and wound.  Neurological: Negative for dizziness and headaches.  Hematological: Does not bruise/bleed easily.  Psychiatric/Behavioral: Negative for agitation, behavioral problems, confusion, decreased concentration, dysphoric mood, hallucinations, self-injury, sleep disturbance and suicidal ideas. The patient is not nervous/anxious and is not hyperactive.        Objective:   Physical Exam Vitals signs and nursing note reviewed.  Constitutional:      General: He is not in acute distress.    Appearance: Normal appearance. He is not ill-appearing, toxic-appearing or diaphoretic.  HENT:     Head: Normocephalic and atraumatic.     Right Ear: Tympanic membrane, ear canal and external ear normal. There is no impacted cerumen.     Left Ear: Tympanic membrane, ear canal and external ear normal. There is no impacted cerumen.     Nose:  No congestion or rhinorrhea.     Mouth/Throat:     Mouth: Mucous membranes are moist.     Pharynx: No oropharyngeal exudate or posterior oropharyngeal erythema.  Eyes:     General: Lids are normal. No visual field deficit.    Extraocular Movements: Extraocular movements intact.     Right eye: Normal extraocular motion and no nystagmus.      Left eye: Normal extraocular motion and no nystagmus.     Conjunctiva/sclera: Conjunctivae normal.     Pupils: Pupils are equal, round, and reactive to light.  Neck:     Musculoskeletal: Normal range of motion.  Cardiovascular:     Rate and Rhythm: Normal rate.     Pulses: Normal pulses.     Heart sounds: Normal heart sounds. No murmur. No friction rub. No gallop.   Pulmonary:     Effort: Pulmonary effort is normal. No respiratory distress.     Breath sounds: Normal breath sounds. No stridor. No wheezing, rhonchi or rales.  Chest:     Chest wall: No tenderness.  Abdominal:     General: Bowel sounds are normal. There is no distension.     Tenderness: There is no abdominal tenderness.  Musculoskeletal: Normal range of motion.        General: No tenderness or deformity.  Lymphadenopathy:     Cervical: No cervical adenopathy.  Skin:    General: Skin is warm and dry.     Capillary Refill: Capillary refill takes less than 2 seconds.  Neurological:     Mental Status: He is alert and oriented to person, place, and time.     Motor: No weakness.     Coordination: Coordination normal.     Gait: Gait normal.     Deep Tendon Reflexes: Reflexes normal.  Psychiatric:        Mood and Affect: Mood normal.        Behavior: Behavior normal.        Thought Content: Thought content normal.        Judgment: Judgment normal.           Assessment & Plan:   1. Encounter for examination required by Department of Transportation (DOT)     Encounter for examination required by Department of Transportation (DOT) Hard copy DOT forms filled out- scanned into system [employer] Updated  Your blood pressure, urinalysis, and physical examination- all normal. 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 Recommend stopping smokeless tobacco. Follow-up with Dr. Raliegh Scarlet this summer for complete physical with labs (is she feels that you need them).    FOLLOW-UP:  Return in about 3  months (around 07/24/2019) for CPE, Fasting Labs.

## 2019-04-24 ENCOUNTER — Other Ambulatory Visit: Payer: Self-pay

## 2019-04-24 ENCOUNTER — Ambulatory Visit (INDEPENDENT_AMBULATORY_CARE_PROVIDER_SITE_OTHER): Payer: Self-pay | Admitting: Adult Health

## 2019-04-24 VITALS — BP 113/67 | HR 52 | Ht 70.0 in | Wt 220.0 lb

## 2019-04-24 DIAGNOSIS — R8290 Unspecified abnormal findings in urine: Secondary | ICD-10-CM

## 2019-04-24 DIAGNOSIS — Z0289 Encounter for other administrative examinations: Secondary | ICD-10-CM | POA: Insufficient documentation

## 2019-04-24 DIAGNOSIS — Z024 Encounter for examination for driving license: Secondary | ICD-10-CM

## 2019-04-24 LAB — POCT URINALYSIS DIPSTICK
Bilirubin, UA: NEGATIVE
Blood, UA: NEGATIVE
Glucose, UA: NEGATIVE
Ketones, UA: NEGATIVE
Leukocytes, UA: NEGATIVE
Nitrite, UA: NEGATIVE
Protein, UA: NEGATIVE
Spec Grav, UA: >=1.03 — AB (ref 1.010–1.025)
Urobilinogen, UA: NEGATIVE E.U./dL — AB
pH, UA: 6 (ref 5.0–8.0)

## 2019-04-24 NOTE — Patient Instructions (Addendum)
Healthy Eating Following a healthy eating pattern may help you to achieve and maintain a healthy body weight, reduce the risk of chronic disease, and live a long and productive life. It is important to follow a healthy eating pattern at an appropriate calorie level for your body. Your nutritional needs should be met primarily through food by choosing a variety of nutrient-rich foods. What are tips for following this plan? Reading food labels  Read labels and choose the following: ? Reduced or low sodium. ? Juices with 100% fruit juice. ? Foods with low saturated fats and high polyunsaturated and monounsaturated fats. ? Foods with whole grains, such as whole wheat, cracked wheat, brown rice, and wild rice. ? Whole grains that are fortified with folic acid. This is recommended for women who are pregnant or who want to become pregnant.  Read labels and avoid the following: ? Foods with a lot of added sugars. These include foods that contain brown sugar, corn sweetener, corn syrup, dextrose, fructose, glucose, high-fructose corn syrup, honey, invert sugar, lactose, malt syrup, maltose, molasses, raw sugar, sucrose, trehalose, or turbinado sugar.  Do not eat more than the following amounts of added sugar per day:  6 teaspoons (25 g) for women.  9 teaspoons (38 g) for men. ? Foods that contain processed or refined starches and grains. ? Refined grain products, such as white flour, degermed cornmeal, white bread, and white rice. Shopping  Choose nutrient-rich snacks, such as vegetables, whole fruits, and nuts. Avoid high-calorie and high-sugar snacks, such as potato chips, fruit snacks, and candy.  Use oil-based dressings and spreads on foods instead of solid fats such as butter, stick margarine, or cream cheese.  Limit pre-made sauces, mixes, and "instant" products such as flavored rice, instant noodles, and ready-made pasta.  Try more plant-protein sources, such as tofu, tempeh, black beans,  edamame, lentils, nuts, and seeds.  Explore eating plans such as the Mediterranean diet or vegetarian diet. Cooking  Use oil to saut or stir-fry foods instead of solid fats such as butter, stick margarine, or lard.  Try baking, boiling, grilling, or broiling instead of frying.  Remove the fatty part of meats before cooking.  Steam vegetables in water or broth. Meal planning   At meals, imagine dividing your plate into fourths: ? One-half of your plate is fruits and vegetables. ? One-fourth of your plate is whole grains. ? One-fourth of your plate is protein, especially lean meats, poultry, eggs, tofu, beans, or nuts.  Include low-fat dairy as part of your daily diet. Lifestyle  Choose healthy options in all settings, including home, work, school, restaurants, or stores.  Prepare your food safely: ? Wash your hands after handling raw meats. ? Keep food preparation surfaces clean by regularly washing with hot, soapy water. ? Keep raw meats separate from ready-to-eat foods, such as fruits and vegetables. ? Cook seafood, meat, poultry, and eggs to the recommended internal temperature. ? Store foods at safe temperatures. In general:  Keep cold foods at 59F (4.4C) or below.  Keep hot foods at 159F (60C) or above.  Keep your freezer at [facility] (-17.8C) or below.  Foods are no longer safe to eat when they have been between the temperatures of 40-159F (4.4-60C) for more than 2 hours. What foods should I eat? Fruits Aim to eat 2 cup-equivalents of fresh, canned (in natural juice), or frozen fruits each day. Examples of 1 cup-equivalent of fruit include 1 small apple, 8 large strawberries, 1 cup canned fruit,  cup  dried fruit, or 1 cup 100% juice. Vegetables Aim to eat 2-3 cup-equivalents of fresh and frozen vegetables each day, including different varieties and colors. Examples of 1 cup-equivalent of vegetables include 2 medium carrots, 2 cups raw, leafy greens, 1 cup chopped  vegetable (raw or cooked), or 1 medium baked potato. Grains Aim to eat 6 ounce-equivalents of whole grains each day. Examples of 1 ounce-equivalent of grains include 1 slice of bread, 1 cup ready-to-eat cereal, 3 cups popcorn, or  cup cooked rice, pasta, or cereal. Meats and other proteins Aim to eat 5-6 ounce-equivalents of protein each day. Examples of 1 ounce-equivalent of protein include 1 egg, 1/2 cup nuts or seeds, or 1 tablespoon (16 g) peanut butter. A cut of meat or fish that is the size of a deck of cards is about 3-4 ounce-equivalents.  Of the protein you eat each week, try to have at least 8 ounces come from seafood. This includes salmon, trout, herring, and anchovies. Dairy Aim to eat 3 cup-equivalents of fat-free or low-fat dairy each day. Examples of 1 cup-equivalent of dairy include 1 cup (240 mL) milk, 8 ounces (250 g) yogurt, 1 ounces (44 g) natural cheese, or 1 cup (240 mL) fortified soy milk. Fats and oils  Aim for about 5 teaspoons (21 g) per day. Choose monounsaturated fats, such as canola and olive oils, avocados, peanut butter, and most nuts, or polyunsaturated fats, such as sunflower, corn, and soybean oils, walnuts, pine nuts, sesame seeds, sunflower seeds, and flaxseed. Beverages  Aim for six 8-oz glasses of water per day. Limit coffee to three to five 8-oz cups per day.  Limit caffeinated beverages that have added calories, such as soda and energy drinks.  Limit alcohol intake to no more than 1 drink a day for nonpregnant women and 2 drinks a day for men. One drink equals 12 oz of beer (355 mL), 5 oz of wine (148 mL), or 1 oz of hard liquor (44 mL). Seasoning and other foods  Avoid adding excess amounts of salt to your foods. Try flavoring foods with herbs and spices instead of salt.  Avoid adding sugar to foods.  Try using oil-based dressings, sauces, and spreads instead of solid fats. This information is based on general [place] nutrition guidelines. For more  information, visit [employer]. Exact amounts may vary based on your nutrition needs. Summary  A healthy eating plan may help you to maintain a healthy weight, reduce the risk of chronic diseases, and stay active throughout your life.  Plan your meals. Make sure you eat the right portions of a variety of nutrient-rich foods.  Try baking, boiling, grilling, or broiling instead of frying.  Choose healthy options in all settings, including home, work, school, restaurants, or stores. This information is not intended to replace advice given to you by your health care provider. Make sure you discuss any questions you have with your health care provider. Document Released: 03/25/2018 Document Revised: 03/25/2018 Document Reviewed: 03/25/2018 Elsevier Interactive Patient Education  2019 [place] blood pressure, urinalysis, and physical examination- all normal. 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 Recommend stopping smokeless tobacco. Follow-up with Dr. Raliegh Scarlet this summer for complete physical with labs (is she feels that you need them). NICE TO MEET YOU!

## 2019-04-24 NOTE — Assessment & Plan Note (Addendum)
Hard copy DOT forms filled out- scanned into system [employer] Updated  Your blood pressure, urinalysis, and physical examination- all normal. 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 Recommend stopping smokeless tobacco. Follow-up with Dr. Raliegh Scarlet this summer for complete physical with labs (is she feels that you need them).

## 2022-10-25 DIAGNOSIS — H6692 Otitis media, unspecified, left ear: Secondary | ICD-10-CM | POA: Diagnosis not present

## 2022-10-25 DIAGNOSIS — L02811 Cutaneous abscess of head [any part, except face]: Secondary | ICD-10-CM | POA: Diagnosis not present

## 2022-10-30 ENCOUNTER — Ambulatory Visit (INDEPENDENT_AMBULATORY_CARE_PROVIDER_SITE_OTHER): Payer: BC Managed Care – PPO | Admitting: Physician Assistant

## 2022-10-30 ENCOUNTER — Encounter: Payer: Self-pay | Admitting: Physician Assistant

## 2022-10-30 VITALS — BP 130/82 | HR 81 | Temp 97.8°F | Resp 18 | Ht 70.0 in | Wt 295.0 lb

## 2022-10-30 DIAGNOSIS — L0291 Cutaneous abscess, unspecified: Secondary | ICD-10-CM | POA: Diagnosis not present

## 2022-10-30 DIAGNOSIS — J309 Allergic rhinitis, unspecified: Secondary | ICD-10-CM | POA: Diagnosis not present

## 2022-10-30 NOTE — Progress Notes (Signed)
  Established patient acute visit   Patient: Richard Petersen   DOB: [birth year]   26 y.o. Male  MRN: [id number] Visit Date: 10/30/2022  Chief Complaint  Patient presents with   Skin Problem    Scabs on left side of scalp   Subjective    HPI HPI     Skin Problem    Additional comments: Scabs on left side of scalp      Last edited by Gemma Payor, CMA on 10/30/2022  8:23 AM.      Patient presents with c/o skin infection. Patient reports went hunting in [place] 2 weekends ago, states on the way back home had a bump that appeared on his head which started swelling and was painful. Went to urgent care last Wednesday, reports was given two medications- doxycycline and amoxicillin. Was told he had an abscess and ear infection. Feeling better. Patient also has c/o runny nose, cough, and nasal congestion. ST has resolved. No fever, chills, night sweats, chest pain or shortness of breath. Denies sick contact exposures. Symptoms started 3 days ago. States taking a decongestant. Feels better today, felt worse Saturday.     Medications: No outpatient medications prior to visit.   No facility-administered medications prior to visit.    Review of Systems Review of Systems:  A fourteen system review of systems was performed and found to be positive as per HPI.     Objective    BP 130/82 (BP Location: Left Arm, Patient Position: Sitting, Cuff Size: Large)   Pulse 81   Temp 97.8 F (36.6 C) (Temporal)   Resp 18   Ht '5\' 10"'$  (1.778 m)   Wt 295 lb (133.8 kg)   SpO2 96%   BMI 42.33 kg/m    Physical Exam  General:  Cooperative, in no acute distress , appropriate for stated age.  Neuro:  Alert and oriented,  extra-ocular muscles intact  HEENT:  Normocephalic, atraumatic, PERRL, no sinus tenderness, middle ear effusion of left ear without redness or cloudiness, normal TM of right ear, pale nasal mucosa, mildly boggy turbinates, mild erythema of posterior oropharynx, neck supple, no  adenopathy   Skin:  3 healed lesions with scab formation over left side of scalp, no warmth or fluctuance  Cardiac:  RRR, S1 S2 Respiratory: CTA B/L w/o wheezing, crackles or rales. Vascular:  Ext warm, no cyanosis apprec.; cap RF less 2 sec. Psych:  No HI/SI, judgement and insight good, Euthymic mood. Full Affect.    No results found for any visits on 10/30/22.  Assessment & Plan     Reassurance provided skin and ear infections improving and resolving. Recommend to complete doxycycline and amoxicillin as prescribed. Discussed respiratory symptoms likely secondary to allergic rhinitis. Recommend to continue with home supportive care including decongestant and also recommend oral antihistamine. Advised on two antibiotics that would cover for bacterial respiratory infection.    Return if symptoms worsen or fail to improve.        Lorrene Reid, PA-C  [facility] at [facility] [phone] (phone) [phone] (fax)  [place]

## 2022-10-30 NOTE — Patient Instructions (Signed)
Skin Abscess  A skin abscess is an infected area on or under your skin that contains a collection of pus and other material. An abscess may also be called a furuncle, carbuncle, or boil. An abscess can occur in or on almost any part of your body. Some abscesses break open (rupture) on their own. Most continue to get worse unless they are treated. The infection can spread deeper into the body and eventually into your blood, which can make you feel ill. Treatment usually involves draining the abscess. What are the causes? An abscess occurs when germs, like bacteria, pass through your skin and cause an infection. This may be caused by: A scrape or cut on your skin. A puncture wound through your skin, including a needle injection or insect bite. Blocked oil or sweat glands. Blocked and infected hair follicles. A cyst that forms beneath your skin (sebaceous cyst) and becomes infected. What increases the risk? This condition is more likely to develop in people who: Have a weak body defense system (immune system). Have diabetes. Have dry and irritated skin. Get frequent injections or use illegal IV drugs. Have a foreign body in a wound, such as a splinter. Have problems with their lymph system or veins. What are the signs or symptoms? Symptoms of this condition include: A painful, firm bump under the skin. A bump with pus at the top. This may break through the skin and drain. Other symptoms include: Redness surrounding the abscess site. Warmth. Swelling of the lymph nodes (glands) near the abscess. Tenderness. A sore on the skin. How is this diagnosed? This condition may be diagnosed based on: A physical exam. Your medical history. A sample of pus. This may be used to find out what is causing the infection. Blood tests. Imaging tests, such as an ultrasound, CT scan, or MRI. How is this treated? A small abscess that drains on its own may not need treatment. Treatment for larger abscesses  may include: Moist heat or heat pack applied to the area several times a day. A procedure to drain the abscess (incision and drainage). Antibiotic medicines. For a severe abscess, you may first get antibiotics through an IV and then change to antibiotics by mouth. Follow these instructions at home: Medicines  Take over-the-counter and prescription medicines only as told by your health care provider. If you were prescribed an antibiotic medicine, take it as told by your health care provider. Do not stop taking the antibiotic even if you start to feel better. Abscess care  If you have an abscess that has not drained, apply heat to the affected area. Use the heat source that your health care provider recommends, such as a moist heat pack or a heating pad. Place a towel between your skin and the heat source. Leave the heat on for 20-30 minutes. Remove the heat if your skin turns bright red. This is especially important if you are unable to feel pain, heat, or cold. You may have a greater risk of getting burned. Follow instructions from your health care provider about how to take care of your abscess. Make sure you: Cover the abscess with a bandage (dressing). Change your dressing or gauze as told by your health care provider. Wash your hands with soap and water before you change the dressing or gauze. If soap and water are not available, use hand sanitizer. Check your abscess every day for signs of a worsening infection. Check for: More redness, swelling, or pain. More fluid or blood. Warmth.   More pus or a bad smell. General instructions To avoid spreading the infection: Do not share personal care items, towels, or hot tubs with others. Avoid making skin contact with other people. Keep all follow-up visits as told by your health care provider. This is important. Contact a health care provider if you have: More redness, swelling, or pain around your abscess. More fluid or blood coming from  your abscess. Warm skin around your abscess. More pus or a bad smell coming from your abscess. Muscle aches. Chills or a general ill feeling. Get help right away if you: Have severe pain. See red streaks on your skin spreading away from the abscess. See redness that spreads quickly. Have a fever or chills. Summary A skin abscess is an infected area on or under your skin that contains a collection of pus and other material. A small abscess that drains on its own may not need treatment. Treatment for larger abscesses may include having a procedure to drain the abscess and taking an antibiotic. This information is not intended to replace advice given to you by your health care provider. Make sure you discuss any questions you have with your health care provider. Document Revised: 03/16/2022 Document Reviewed: 09/19/2021 Elsevier Patient Education  2023 [employer]  

## 2022-11-18 DIAGNOSIS — J00 Acute nasopharyngitis [common cold]: Secondary | ICD-10-CM | POA: Diagnosis not present

## 2022-11-18 DIAGNOSIS — J45909 Unspecified asthma, uncomplicated: Secondary | ICD-10-CM | POA: Diagnosis not present
